# Patient Record
Sex: Female | Born: 2000 | Hispanic: Yes | Marital: Single | Smoking: Never smoker
Health system: Southern US, Community
[De-identification: ages and names within clinical notes are randomized; demographics above are authoritative.]

---
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
## 2022-01-24 DIAGNOSIS — Z01419 Encounter for gynecological examination (general) (routine) without abnormal findings: Secondary | ICD-10-CM | POA: Diagnosis not present

## 2022-01-24 DIAGNOSIS — R300 Dysuria: Secondary | ICD-10-CM | POA: Diagnosis not present

## 2022-05-31 DIAGNOSIS — N3090 Cystitis, unspecified without hematuria: Secondary | ICD-10-CM | POA: Diagnosis not present

## 2022-07-23 DIAGNOSIS — J029 Acute pharyngitis, unspecified: Secondary | ICD-10-CM | POA: Diagnosis not present

## 2022-07-23 DIAGNOSIS — R52 Pain, unspecified: Secondary | ICD-10-CM | POA: Diagnosis not present

## 2022-07-23 DIAGNOSIS — J069 Acute upper respiratory infection, unspecified: Secondary | ICD-10-CM | POA: Diagnosis not present

## 2022-09-29 ENCOUNTER — Ambulatory Visit (INDEPENDENT_AMBULATORY_CARE_PROVIDER_SITE_OTHER): Payer: BC Managed Care – PPO | Admitting: Family Medicine

## 2022-09-29 ENCOUNTER — Encounter: Payer: Self-pay | Admitting: Family Medicine

## 2022-09-29 VITALS — BP 108/68 | HR 76 | Temp 98.2°F | Resp 16 | Ht 61.75 in | Wt 141.2 lb

## 2022-09-29 DIAGNOSIS — R5383 Other fatigue: Secondary | ICD-10-CM | POA: Diagnosis not present

## 2022-09-29 DIAGNOSIS — Z0000 Encounter for general adult medical examination without abnormal findings: Secondary | ICD-10-CM

## 2022-09-29 DIAGNOSIS — M25562 Pain in left knee: Secondary | ICD-10-CM

## 2022-09-29 MED ORDER — LEVONORGESTREL 20 MCG/DAY IU IUD: 1.0000 | INTRAUTERINE_SYSTEM | Freq: Once | INTRAUTERINE | 0 refills | Status: AC

## 2022-09-29 NOTE — Assessment & Plan Note (Signed)
Ghm utd Check labs  See AVS Health Maintenance  Topic Date Due   HPV VACCINES (1 - 2-dose series) Never done   CHLAMYDIA SCREENING  Never done   HIV Screening  Never done   Hepatitis C Screening  Never done   COVID-19 Vaccine (3 - 2023-24 season) 11/29/2021   INFLUENZA VACCINE  10/30/2022   DTaP/Tdap/Td (6 - Td or Tdap) 07/29/2023

## 2022-09-29 NOTE — Progress Notes (Signed)
Established Patient Office Visit  Subjective   Patient ID: Erica Rivera, female    DOB: 2000/11/08  Age: 22 y.o. MRN: [id number]  Chief Complaint  Patient presents with   Annual Exam    HPI Discussed the use of AI scribe software for clinical note transcription with the patient, who gave verbal consent to proceed.  History of Present Illness   The patient, a student at [employer], presents with left knee pain that has been ongoing since March. They believe the injury occurred while playing volleyball on an intramural team. Despite the pain, they have been able to continue with physical activities such as running, but note that the knee often 'pops' during and after exercise. The pain has been exacerbated by recent increased walking during a trip to [place]. They also report that the knee sometimes feels like it will 'give out' during running and that standing or balancing on it can be painful.  In addition to the knee pain, the patient has been experiencing fatigue and shortness of breath since starting on Mirena for birth control. They report that these symptoms have made it difficult to maintain their usual level of physical activity. They also suspect they may be experiencing symptoms of iron deficiency.      Patient Active Problem List   Diagnosis Date Noted   Preventative health care 09/29/2022   Other fatigue 09/29/2022   Acute pain of left knee 09/29/2022   Chest pain 06/27/2019   Appendicitis 08/13/2017   Acute appendicitis 08/13/2017   DYSURIA 01/11/2010   History reviewed. No pertinent past medical history. Past Surgical History:  Procedure Laterality Date   LAPAROSCOPIC APPENDECTOMY N/A 08/12/2017   Procedure: APPENDECTOMY LAPAROSCOPIC;  Surgeon: Leonia Corona, MD;  Location: [facility] OR;  Service: Pediatrics;  Laterality: N/A;   Social History   Tobacco Use   Smoking status: Never   Smokeless tobacco: Never  Substance Use Topics    Alcohol use: No   Drug use: No   Social History   Socioeconomic History   Marital status: Single    Spouse name: Not on file   Number of children: Not on file   Years of education: Not on file   Highest education level: Not on file  Occupational History   Occupation: student    Comment: [employer]  Tobacco Use   Smoking status: Never   Smokeless tobacco: Never  Substance and Sexual Activity   Alcohol use: No   Drug use: No   Sexual activity: Yes    Partners: Male    Birth control/protection: Condom, I.U.D.  Other Topics Concern   Not on file  Social History Narrative   Volley ball   Gym-- running    Social Determinants of Health   Financial Resource Strain: Not on file  Food Insecurity: Not on file  Transportation Needs: Not on file  Physical Activity: Not on file  Stress: Not on file  Social Connections: Not on file  Intimate Partner Violence: Not on file   Family Status  Relation Name Status   Mother  Alive   Father  Alive   Sister  Alive   Brother  Alive   Family History  Problem Relation Age of Onset   Asthma Mother    Asthma Brother    No Known Allergies    Review of Systems  Constitutional:  Negative for chills, fever and malaise/fatigue.  HENT:  Negative for congestion and hearing loss.   Eyes:  Negative for blurred vision and discharge.  Respiratory:  Negative for cough, sputum production and shortness of breath.   Cardiovascular:  Negative for chest pain, palpitations and leg swelling.  Gastrointestinal:  Negative for abdominal pain, blood in stool, constipation, diarrhea, heartburn, nausea and vomiting.  Genitourinary:  Negative for dysuria, frequency, hematuria and urgency.  Musculoskeletal:  Negative for back pain, falls and myalgias.  Skin:  Negative for rash.  Neurological:  Negative for dizziness, sensory change, loss of consciousness, weakness and headaches.  Endo/Heme/Allergies:  Negative for environmental allergies. Does not  bruise/bleed easily.  Psychiatric/Behavioral:  Negative for depression and suicidal ideas. The patient is not nervous/anxious and does not have insomnia.       Objective:     BP 108/68   Pulse 76   Temp 98.2 F (36.8 C) (Oral)   Resp 16   Ht 5' 1.75" (1.568 m)   Wt 141 lb 4 oz (64.1 kg)   LMP  (LMP Unknown)   SpO2 96%   BMI 26.04 kg/m  BP Readings from Last 3 Encounters:  09/29/22 108/68  10/17/19 (!) 96/58 (10 %, Z = -1.28 /  29 %, Z = -0.55)*  07/19/19 98/80 (16 %, Z = -0.99 /  96 %, Z = 1.75)*   *BP percentiles are based on the 2017 AAP Clinical Practice Guideline for girls   Wt Readings from Last 3 Encounters:  09/29/22 141 lb 4 oz (64.1 kg) (70 %, Z= 0.54)*  10/17/19 117 lb 12.8 oz (53.4 kg) (42 %, Z= -0.20)*  07/19/19 112 lb 12.8 oz (51.2 kg) (32 %, Z= -0.46)*   * Growth percentiles are based on CDC (Girls, 2-20 Years) data.   SpO2 Readings from Last 3 Encounters:  09/29/22 96%  10/17/19 99%  07/19/19 99%      Physical Exam Vitals and nursing note reviewed.  Constitutional:      General: She is not in acute distress.    Appearance: Normal appearance. She is well-developed.  HENT:     Head: Normocephalic and atraumatic.     Right Ear: Tympanic membrane, ear canal and external ear normal. There is no impacted cerumen.     Left Ear: Tympanic membrane, ear canal and external ear normal. There is no impacted cerumen.     Nose: Nose normal.     Mouth/Throat:     Mouth: Mucous membranes are moist.     Pharynx: Oropharynx is clear. No oropharyngeal exudate or posterior oropharyngeal erythema.  Eyes:     General: No scleral icterus.       Right eye: No discharge.        Left eye: No discharge.     Conjunctiva/sclera: Conjunctivae normal.     Pupils: Pupils are equal, round, and reactive to light.  Neck:     Thyroid: No thyromegaly or thyroid tenderness.     Vascular: No JVD.  Cardiovascular:     Rate and Rhythm: Normal rate and regular rhythm.     Heart  sounds: Normal heart sounds. No murmur heard. Pulmonary:     Effort: Pulmonary effort is normal. No respiratory distress.     Breath sounds: Normal breath sounds.  Abdominal:     General: Bowel sounds are normal. There is no distension.     Palpations: Abdomen is soft. There is no mass.     Tenderness: There is no abdominal tenderness. There is no guarding or rebound.  Genitourinary:    Vagina: Normal.  Musculoskeletal:  General: Normal range of motion.     Cervical back: Normal range of motion and neck supple.     Right lower leg: No edema.     Left lower leg: No edema.  Lymphadenopathy:     Cervical: No cervical adenopathy.  Skin:    General: Skin is warm and dry.     Findings: No erythema or rash.  Neurological:     Mental Status: She is alert and oriented to person, place, and time.     Cranial Nerves: No cranial nerve deficit.     Deep Tendon Reflexes: Reflexes are normal and symmetric.  Psychiatric:        Mood and Affect: Mood normal.        Behavior: Behavior normal.        Thought Content: Thought content normal.        Judgment: Judgment normal.      No results found for any visits on 09/29/22.  Last CBC Lab Results  Component Value Date   WBC 7.4 06/27/2019   HGB 13.5 06/27/2019   HCT 39.3 06/27/2019   MCV 89.9 06/27/2019   MCH 30.3 08/12/2017   RDW 12.4 06/27/2019   PLT 220.0 06/27/2019   Last metabolic panel Lab Results  Component Value Date   GLUCOSE 89 07/19/2019   NA 137 07/19/2019   K 3.9 07/19/2019   CL 101 07/19/2019   CO2 27 07/19/2019   BUN 10 07/19/2019   CREATININE 0.54 07/19/2019   GFRNONAA NOT CALCULATED 08/12/2017   CALCIUM 9.5 07/19/2019   PROT 7.1 07/19/2019   ALBUMIN 4.9 07/19/2019   BILITOT 1.6 (H) 07/19/2019   ALKPHOS 45 07/19/2019   AST 31 07/19/2019   ALT 20 07/19/2019   ANIONGAP 12 08/12/2017   Last lipids Lab Results  Component Value Date   CHOL 166 07/19/2019   HDL 46.60 07/19/2019   LDLCALC 107 (H)  07/19/2019   TRIG 65.0 07/19/2019   CHOLHDL 4 07/19/2019   Last hemoglobin A1c No results found for: "HGBA1C" Last thyroid functions Lab Results  Component Value Date   TSH 1.33 06/27/2019   Last vitamin D No results found for: "25OHVITD2", "25OHVITD3", "VD25OH" Last vitamin B12 and Folate No results found for: "VITAMINB12", "FOLATE"    The ASCVD Risk score (Arnett DK, et al., 2019) failed to calculate for the following reasons:   The 2019 ASCVD risk score is only valid for ages 26 to 9    Assessment & Plan:   Problem List Items Addressed This Visit       Unprioritized   Preventative health care - Primary    Ghm utd Check labs  See AVS Health Maintenance  Topic Date Due   HPV VACCINES (1 - 2-dose series) Never done   CHLAMYDIA SCREENING  Never done   HIV Screening  Never done   Hepatitis C Screening  Never done   COVID-19 Vaccine (3 - 2023-24 season) 11/29/2021   INFLUENZA VACCINE  10/30/2022   DTaP/Tdap/Td (6 - Td or Tdap) 07/29/2023        Relevant Orders   CBC with Differential/Platelet   Comprehensive metabolic panel   Lipid panel   TSH   Other fatigue   Relevant Orders   CBC with Differential/Platelet   Comprehensive metabolic panel   Lipid panel   TSH   Vitamin B12   VITAMIN D 25 Hydroxy (Vit-D Deficiency, Fractures)   POCT Urinalysis Dipstick (Automated)   Acute pain of left knee  Refer to sport med for eval      Relevant Orders   Ambulatory referral to Sports Medicine    Return if symptoms worsen or fail to improve.    Donato Schultz, DO

## 2022-09-29 NOTE — Patient Instructions (Signed)
Preventive Care 20-21 Years Old, Female Preventive care refers to lifestyle choices and visits with your health care provider that can promote health and wellness. At this stage in your life, you may start seeing a primary care physician instead of a pediatrician for your preventive care. Preventive care visits are also called wellness exams. What can I expect for my preventive care visit? Counseling During your preventive care visit, your health care provider may ask about your: Medical history, including: Past medical problems. Family medical history. Pregnancy history. Current health, including: Menstrual cycle. Method of birth control. Emotional well-being. Home life and relationship well-being. Sexual activity and sexual health. Lifestyle, including: Alcohol, nicotine or tobacco, and drug use. Access to firearms. Diet, exercise, and sleep habits. Sunscreen use. Motor vehicle safety. Physical exam Your health care provider may check your: Height and weight. These may be used to calculate your BMI (body mass index). BMI is a measurement that tells if you are at a healthy weight. Waist circumference. This measures the distance around your waistline. This measurement also tells if you are at a healthy weight and may help predict your risk of certain diseases, such as type 2 diabetes and high blood pressure. Heart rate and blood pressure. Body temperature. Skin for abnormal spots. Breasts. What immunizations do I need?  Vaccines are usually given at various ages, according to a schedule. Your health care provider will recommend vaccines for you based on your age, medical history, and lifestyle or other factors, such as travel or where you work. What tests do I need? Screening Your health care provider may recommend screening tests for certain conditions. This may include: Vision and hearing tests. Lipid and cholesterol levels. Pelvic exam and Pap test. Hepatitis B  test. Hepatitis C test. HIV (human immunodeficiency virus) test. STI (sexually transmitted infection) testing, if you are at risk. Tuberculosis skin test if you have symptoms. BRCA-related cancer screening. This may be done if you have a family history of breast, ovarian, tubal, or peritoneal cancers. Talk with your health care provider about your test results, treatment options, and if necessary, the need for more tests. Follow these instructions at home: Eating and drinking  Eat a healthy diet that includes fresh fruits and vegetables, whole grains, lean protein, and low-fat dairy products. Drink enough fluid to keep your urine pale yellow. Do not drink alcohol if: Your health care provider tells you not to drink. You are pregnant, may be pregnant, or are planning to become pregnant. You are under the legal drinking age. In the [place] the legal drinking age is 21. If you drink alcohol: Limit how much you have to 0-1 drink a day. Know how much alcohol is in your drink. In the [place] one drink equals one 12 oz bottle of beer (355 mL), one 5 oz glass of wine (148 mL), or one 1 oz glass of hard liquor (44 mL). Lifestyle Brush your teeth every morning and night with fluoride toothpaste. Floss one time each day. Exercise for at least 30 minutes 5 or more days of the week. Do not use any products that contain nicotine or tobacco. These products include cigarettes, chewing tobacco, and vaping devices, such as e-cigarettes. If you need help quitting, ask your health care provider. Do not use drugs. If you are sexually active, practice safe sex. Use a condom or other form of protection to prevent STIs. If you do not wish to become pregnant, use a form of birth control. If you plan to become pregnant,   see your health care provider for a prepregnancy visit. Find healthy ways to manage stress, such as: Meditation, yoga, or listening to music. Journaling. Talking to a trusted person. Spending time  with friends and family. Safety Always wear your seat belt while driving or riding in a vehicle. Do not drive: If you have been drinking alcohol. Do not ride with someone who has been drinking. When you are tired or distracted. While texting. If you have been using any mind-altering substances or drugs. Wear a helmet and other protective equipment during sports activities. If you have firearms in your house, make sure you follow all gun safety procedures. Seek help if you have been bullied, physically abused, or sexually abused. Use the internet responsibly to avoid dangers, such as online bullying and online sex predators. What's next? Go to your health care provider once a year for an annual wellness visit. Ask your health care provider how often you should have your eyes and teeth checked. Stay up to date on all vaccines. This information is not intended to replace advice given to you by your health care provider. Make sure you discuss any questions you have with your health care provider. Document Revised: 09/12/2020 Document Reviewed: 09/12/2020 Elsevier Patient Education  2024 [employer]vier Inc.  

## 2022-09-29 NOTE — Assessment & Plan Note (Signed)
Refer to sport med for eval

## 2022-09-30 LAB — LIPID PANEL
Cholesterol: 184 mg/dL (ref 0–200)
HDL: 36 mg/dL — ABNORMAL LOW (ref >39.00)
LDL Cholesterol: 118 mg/dL — ABNORMAL HIGH (ref 0–99)
NonHDL: 147.85
Total CHOL/HDL Ratio: 5
Triglycerides: 151 mg/dL — ABNORMAL HIGH (ref 0.0–149.0)
VLDL: 30.2 mg/dL (ref 0.0–40.0)

## 2022-09-30 LAB — CBC WITH DIFFERENTIAL/PLATELET
Basophils Absolute: 0 10*3/uL (ref 0.0–0.1)
Basophils Relative: 0.4 % (ref 0.0–3.0)
Eosinophils Absolute: 0.1 10*3/uL (ref 0.0–0.7)
Eosinophils Relative: 1.6 % (ref 0.0–5.0)
HCT: 39.7 % (ref 36.0–49.0)
Hemoglobin: 13.2 g/dL (ref 12.0–16.0)
Lymphocytes Relative: 34.8 % (ref 24.0–48.0)
Lymphs Abs: 2 10*3/uL (ref 0.7–4.0)
MCHC: 33.2 g/dL (ref 31.0–37.0)
MCV: 90.8 fl (ref 78.0–98.0)
Monocytes Absolute: 0.6 10*3/uL (ref 0.1–1.0)
Monocytes Relative: 10 % (ref 3.0–12.0)
Neutro Abs: 3.1 10*3/uL (ref 1.4–7.7)
Neutrophils Relative %: 53.2 % (ref 43.0–71.0)
Platelets: 257 10*3/uL (ref 150.0–575.0)
RBC: 4.37 Mil/uL (ref 3.80–5.70)
RDW: 12.4 % (ref 11.4–15.5)
WBC: 5.8 10*3/uL (ref 4.5–13.5)

## 2022-09-30 LAB — COMPREHENSIVE METABOLIC PANEL
ALT: 16 U/L (ref 0–35)
AST: 16 U/L (ref 0–37)
Albumin: 4.5 g/dL (ref 3.5–5.2)
Alkaline Phosphatase: 57 U/L (ref 47–119)
BUN: 11 mg/dL (ref 6–23)
CO2: 28 mEq/L (ref 19–32)
Calcium: 9.5 mg/dL (ref 8.4–10.5)
Chloride: 104 mEq/L (ref 96–112)
Creatinine, Ser: 0.57 mg/dL (ref 0.40–1.20)
GFR: 131.22 mL/min (ref >60.00)
Glucose, Bld: 99 mg/dL (ref 70–99)
Potassium: 4.5 mEq/L (ref 3.5–5.1)
Sodium: 139 mEq/L (ref 135–145)
Total Bilirubin: 0.7 mg/dL (ref 0.2–1.2)
Total Protein: 7.1 g/dL (ref 6.0–8.3)

## 2022-09-30 LAB — VITAMIN B12: Vitamin B-12: 328 pg/mL (ref 211–911)

## 2022-09-30 LAB — TSH: TSH: 1.64 u[IU]/mL (ref 0.40–5.00)

## 2022-09-30 LAB — VITAMIN D 25 HYDROXY (VIT D DEFICIENCY, FRACTURES): VITD: 21.45 ng/mL — ABNORMAL LOW (ref 30.00–100.00)

## 2023-04-07 ENCOUNTER — Encounter: Payer: Self-pay | Admitting: Family Medicine

## 2023-04-07 ENCOUNTER — Ambulatory Visit: Payer: BC Managed Care – PPO | Admitting: Family Medicine

## 2023-04-07 VITALS — BP 116/74 | HR 80 | Resp 18 | Ht 61.0 in | Wt 146.0 lb

## 2023-04-07 DIAGNOSIS — Z111 Encounter for screening for respiratory tuberculosis: Secondary | ICD-10-CM | POA: Diagnosis not present

## 2023-04-07 DIAGNOSIS — Z020 Encounter for examination for admission to educational institution: Secondary | ICD-10-CM | POA: Diagnosis not present

## 2023-04-07 DIAGNOSIS — Z23 Encounter for immunization: Secondary | ICD-10-CM | POA: Diagnosis not present

## 2023-04-07 DIAGNOSIS — Z3009 Encounter for other general counseling and advice on contraception: Secondary | ICD-10-CM

## 2023-04-07 DIAGNOSIS — Z7185 Encounter for immunization safety counseling: Secondary | ICD-10-CM | POA: Diagnosis not present

## 2023-04-07 NOTE — Telephone Encounter (Signed)
Printed and given to PCP

## 2023-04-08 ENCOUNTER — Encounter: Payer: Self-pay | Admitting: Family Medicine

## 2023-04-08 DIAGNOSIS — Z30432 Encounter for removal of intrauterine contraceptive device: Secondary | ICD-10-CM | POA: Diagnosis not present

## 2023-04-08 NOTE — Patient Instructions (Signed)
 Flu and tdap vaccine given today TB Gold test drawn  Return to office 1 year or sooner as needed

## 2023-04-08 NOTE — Progress Notes (Signed)
 Established Patient Office Visit  Subjective   Patient ID: Erica Rivera, female    DOB: 03/06/2001  Age: 23 y.o. MRN: [id number]  No chief complaint on file.   HPI Discussed the use of AI scribe software for clinical note transcription with the patient, who gave verbal consent to proceed.  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 She presented for a routine visit to complete a vaccination verification form and a vaccine checklist required for her application. The patient reported that she is up to date with most of her immunizations, but still needed to receive the flu shot and tetanus vaccine. She also mentioned that she has not received the COVID-19 vaccine yet.  The patient also reported that she has not completed the two required TB skin tests. The patient also mentioned that she is sexually active and currently on birth control. The patient decided to consider this option and did not receive the HPV vaccine during this visit.  The patient also expressed a need for recommendations for physical therapy observation hours. She is open to opportunities both near her school and home during the summer. The patient is also considering different physical therapy programs, including those at [facility] [place], [place], [place], and [facility].      Patient Active Problem List   Diagnosis Date Noted   Preventative health care 09/29/2022   Other fatigue 09/29/2022   Acute pain of left knee 09/29/2022   Chest pain 06/27/2019   Appendicitis 08/13/2017   Acute appendicitis 08/13/2017   DYSURIA 01/11/2010   No past medical history on file. Past Surgical History:  Procedure Laterality Date   LAPAROSCOPIC APPENDECTOMY N/A 08/12/2017   Procedure: APPENDECTOMY LAPAROSCOPIC;  Surgeon: Claudius Kaplan, MD;  Location: [facility] OR;  Service: Pediatrics;  Laterality: N/A;   Social  History   Tobacco Use   Smoking status: Never   Smokeless tobacco: Never  Substance Use Topics   Alcohol use: No   Drug use: No   Social History   Socioeconomic History   Marital status: Single    Spouse name: Not on file   Number of children: Not on file   Years of education: Not on file   Highest education level: Not on file  Occupational History   Occupation: student    Comment: [employer]  Tobacco Use   Smoking status: Never   Smokeless tobacco: Never  Substance and Sexual Activity   Alcohol use: No   Drug use: No   Sexual activity: Yes    Partners: Male    Birth control/protection: Condom, I.U.D.  Other Topics Concern   Not on file  Social History Narrative   [employer]   Gym-- running    Social Drivers of Health   Financial Resource Strain: Not on file  Food Insecurity: Not on file  Transportation Needs: Not on file  Physical Activity: Not on file  Stress: Not on file  Social Connections: Not on file  Intimate Partner Violence: Not on file   Family Status  Relation Name Status   Mother  Alive   Father  Alive   Sister  Alive   Brother  Alive  No partnership data on file   Family History  Problem Relation Age of Onset   Asthma Mother    Asthma Brother    No Known Allergies    Review of Systems  Constitutional:  Negative for fever and malaise/fatigue.  HENT:  Negative for congestion.   Eyes:  Negative for blurred vision.  Respiratory:  Negative for cough and shortness of breath.   Cardiovascular:  Negative for chest pain, palpitations and leg swelling.  Gastrointestinal:  Negative for vomiting.  Musculoskeletal:  Negative for back pain.  Skin:  Negative for rash.  Neurological:  Negative for loss of consciousness and headaches.      Objective:     BP 116/74   Pulse 80   Resp 18   Ht 5' 1 (1.549 m)   Wt 146 lb (66.2 kg)   SpO2 100%   BMI 27.59 kg/m  BP Readings from Last 3 Encounters:  04/07/23 116/74  09/29/22 108/68  10/17/19  (!) 96/58 (10%, Z = -1.28 /  29%, Z = -0.55)*   *BP percentiles are based on the 2017 AAP Clinical Practice Guideline for girls   Wt Readings from Last 3 Encounters:  04/07/23 146 lb (66.2 kg)  09/29/22 141 lb 4 oz (64.1 kg) (70%, Z= 0.54)*  10/17/19 117 lb 12.8 oz (53.4 kg) (42%, Z= -0.20)*   * Growth percentiles are based on CDC (Girls, 2-20 Years) data.   SpO2 Readings from Last 3 Encounters:  04/07/23 100%  09/29/22 96%  10/17/19 99%      Physical Exam Vitals and nursing note reviewed.  Constitutional:      General: She is not in acute distress.    Appearance: Normal appearance. She is well-developed.  HENT:     Head: Normocephalic and atraumatic.  Eyes:     General: No scleral icterus.       Right eye: No discharge.        Left eye: No discharge.  Cardiovascular:     Rate and Rhythm: Normal rate and regular rhythm.     Heart sounds: No murmur heard. Pulmonary:     Effort: Pulmonary effort is normal. No respiratory distress.     Breath sounds: Normal breath sounds.  Musculoskeletal:        General: Normal range of motion.     Cervical back: Normal range of motion and neck supple.     Right lower leg: No edema.     Left lower leg: No edema.  Skin:    General: Skin is warm and dry.  Neurological:     Mental Status: She is alert and oriented to person, place, and time.  Psychiatric:        Mood and Affect: Mood normal.        Behavior: Behavior normal.        Thought Content: Thought content normal.        Judgment: Judgment normal.      No results found for any visits on 04/07/23.  Last CBC Lab Results  Component Value Date   WBC 5.8 09/29/2022   HGB 13.2 09/29/2022   HCT 39.7 09/29/2022   MCV 90.8 09/29/2022   MCH 30.3 08/12/2017   RDW 12.4 09/29/2022   PLT 257.0 09/29/2022   Last metabolic panel Lab Results  Component Value Date   GLUCOSE 99 09/29/2022   NA 139 09/29/2022   K 4.5 09/29/2022   CL 104 09/29/2022   CO2 28 09/29/2022   BUN 11  09/29/2022   CREATININE 0.57 09/29/2022   GFR 131.22 09/29/2022   CALCIUM 9.5 09/29/2022   PROT 7.1 09/29/2022   ALBUMIN 4.5 09/29/2022   BILITOT 0.7 09/29/2022   ALKPHOS 57 09/29/2022   AST  16 09/29/2022   ALT 16 09/29/2022   ANIONGAP 12 08/12/2017   Last lipids Lab Results  Component Value Date   CHOL 184 09/29/2022   HDL 36.00 (L) 09/29/2022   LDLCALC 118 (H) 09/29/2022   TRIG 151.0 (H) 09/29/2022   CHOLHDL 5 09/29/2022   Last hemoglobin A1c No results found for: HGBA1C Last thyroid functions Lab Results  Component Value Date   TSH 1.64 09/29/2022   Last vitamin D  Lab Results  Component Value Date   VD25OH 21.45 (L) 09/29/2022   Last vitamin B12 and Folate Lab Results  Component Value Date   VITAMINB12 328 09/29/2022      The ASCVD Risk score (Arnett DK, et al., 2019) failed to calculate for the following reasons:   The 2019 ASCVD risk score is only valid for ages 72 to 32    Assessment & Plan:   Problem List Items Addressed This Visit   None Visit Diagnoses       Need for influenza vaccination    -  Primary   Relevant Orders   Flu vaccine trivalent PF, 6mos and older(Flulaval,Afluria,Fluarix,Fluzone) (Completed)     Need for tetanus booster       Relevant Orders   Tdap vaccine greater than or equal to 7yo IM (Completed)     Screening-pulmonary TB       Relevant Orders   QuantiFERON-TB Gold Plus     Assessment and Plan    Vaccination Requirements for College Admission   She requires Tdap, flu vaccine, and TB screening for college admission and is up to date with MMR and Varicella vaccines but has not received the COVID-19 vaccine. She is considering the HPV vaccine, which is nearly 100% effective against cervical cancer and genital warts. We discussed the benefits of the HPV vaccine, including prevention of cervical cancer and genital warts, and its non-mandatory status. We will administer the Tdap and flu vaccines and order a TB Gold blood test.  We also discussed the benefits and risks of the HPV vaccine.  Birth Control Management   She is currently on birth control and plans to have it removed tomorrow. Being sexually active, she was advised to continue using birth control to avoid pregnancy before starting school. We will ensure there is a plan for continued birth control after the removal of the current method.  General Health Maintenance   She is generally healthy and up to date with most vaccinations. We advised considering the HPV vaccine for additional protection against cervical cancer and genital warts. We will discuss the benefits and risks of the HPV vaccine and provide documentation of administered vaccines and TB test results for college admission.  Follow-up   We will provide documentation of the flu and Tdap vaccines and the TB Gold test results when available. We recommend local physical therapy practices for observation hours.        Return in about 1 year (around 04/06/2024), or if symptoms worsen or fail to improve.    Margi Edmundson R Lowne Chase, DO

## 2023-04-10 LAB — QUANTIFERON-TB GOLD PLUS
Mitogen-NIL: 8.87 [IU]/mL
NIL: 0.01 [IU]/mL
QuantiFERON-TB Gold Plus: NEGATIVE
TB1-NIL: 0 [IU]/mL
TB2-NIL: 0 [IU]/mL

## 2023-08-14 DIAGNOSIS — Z01419 Encounter for gynecological examination (general) (routine) without abnormal findings: Secondary | ICD-10-CM | POA: Diagnosis not present

## 2023-08-14 DIAGNOSIS — Z113 Encounter for screening for infections with a predominantly sexual mode of transmission: Secondary | ICD-10-CM | POA: Diagnosis not present

## 2024-01-24 DIAGNOSIS — R0602 Shortness of breath: Secondary | ICD-10-CM | POA: Diagnosis not present

## 2024-01-24 DIAGNOSIS — R059 Cough, unspecified: Secondary | ICD-10-CM | POA: Diagnosis not present

## 2024-01-24 DIAGNOSIS — J22 Unspecified acute lower respiratory infection: Secondary | ICD-10-CM | POA: Diagnosis not present

## 2024-01-24 DIAGNOSIS — R0781 Pleurodynia: Secondary | ICD-10-CM | POA: Diagnosis not present

## 2024-01-24 DIAGNOSIS — R051 Acute cough: Secondary | ICD-10-CM | POA: Diagnosis not present

## 2024-02-01 DIAGNOSIS — Z3202 Encounter for pregnancy test, result negative: Secondary | ICD-10-CM | POA: Diagnosis not present

## 2024-02-04 DIAGNOSIS — N926 Irregular menstruation, unspecified: Secondary | ICD-10-CM | POA: Diagnosis not present

## 2024-02-04 DIAGNOSIS — Z113 Encounter for screening for infections with a predominantly sexual mode of transmission: Secondary | ICD-10-CM | POA: Diagnosis not present

## 2024-02-04 DIAGNOSIS — Z3202 Encounter for pregnancy test, result negative: Secondary | ICD-10-CM | POA: Diagnosis not present
